# Patient Record
Sex: Male | Born: 1985 | Race: White | Hispanic: No | Marital: Single | Smoking: Never smoker
Health system: Southern US, Community
[De-identification: ages and names within clinical notes are randomized; demographics above are authoritative.]

## PROBLEM LIST (undated history)

## (undated) HISTORY — PX: FOOT SURGERY: SHX648

---
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
## 2014-03-26 ENCOUNTER — Emergency Department (HOSPITAL_COMMUNITY)
Admission: EM | Admit: 2014-03-26 | Discharge: 2014-03-26 | Disposition: A | Discharge status: Home / Self Care | Payer: BC Managed Care – PPO | Attending: Emergency Medicine | Admitting: Emergency Medicine

## 2014-03-26 ENCOUNTER — Encounter (HOSPITAL_COMMUNITY): Payer: Self-pay | Admitting: Emergency Medicine

## 2014-03-26 DIAGNOSIS — R110 Nausea: Secondary | ICD-10-CM | POA: Insufficient documentation

## 2014-03-26 DIAGNOSIS — R319 Hematuria, unspecified: Secondary | ICD-10-CM | POA: Diagnosis not present

## 2014-03-26 DIAGNOSIS — R630 Anorexia: Secondary | ICD-10-CM | POA: Diagnosis not present

## 2014-03-26 DIAGNOSIS — K920 Hematemesis: Secondary | ICD-10-CM | POA: Diagnosis not present

## 2014-03-26 DIAGNOSIS — R5381 Other malaise: Secondary | ICD-10-CM | POA: Diagnosis present

## 2014-03-26 DIAGNOSIS — R112 Nausea with vomiting, unspecified: Secondary | ICD-10-CM

## 2014-03-26 LAB — URINE MICROSCOPIC-ADD ON

## 2014-03-26 LAB — URINALYSIS, ROUTINE W REFLEX MICROSCOPIC
Glucose, UA: NEGATIVE mg/dL
KETONES UR: NEGATIVE mg/dL
Leukocytes, UA: NEGATIVE
Nitrite: NEGATIVE
PH: 6 (ref 5.0–8.0)
Protein, ur: NEGATIVE mg/dL
Specific Gravity, Urine: 1.025 (ref 1.005–1.030)
Urobilinogen, UA: 1 mg/dL (ref 0.0–1.0)

## 2014-03-26 LAB — CBC
HCT: 48.5 % (ref 39.0–52.0)
Hemoglobin: 16.3 g/dL (ref 13.0–17.0)
MCH: 28.3 pg (ref 26.0–34.0)
MCHC: 33.6 g/dL (ref 30.0–36.0)
MCV: 84.2 fL (ref 78.0–100.0)
Platelets: 321 K/uL (ref 150–400)
RBC: 5.76 MIL/uL (ref 4.22–5.81)
RDW: 13.6 % (ref 11.5–15.5)
WBC: 11.3 K/uL — ABNORMAL HIGH (ref 4.0–10.5)

## 2014-03-26 LAB — LIPASE, BLOOD: Lipase: 20 U/L (ref 11–59)

## 2014-03-26 LAB — COMPREHENSIVE METABOLIC PANEL
ALT: 51 U/L (ref 0–53)
AST: 39 U/L — ABNORMAL HIGH (ref 0–37)
Albumin: 5 g/dL (ref 3.5–5.2)
Alkaline Phosphatase: 61 U/L (ref 39–117)
Anion gap: 16 — ABNORMAL HIGH (ref 5–15)
BUN: 8 mg/dL (ref 6–23)
CO2: 27 mEq/L (ref 19–32)
Calcium: 10.4 mg/dL (ref 8.4–10.5)
Chloride: 97 mEq/L (ref 96–112)
Creatinine, Ser: 1.09 mg/dL (ref 0.50–1.35)
GFR calc Af Amer: >90 mL/min (ref >90)
GFR calc non Af Amer: >90 mL/min (ref >90)
Glucose, Bld: 106 mg/dL — ABNORMAL HIGH (ref 70–99)
Potassium: 4.1 mEq/L (ref 3.7–5.3)
Sodium: 140 mEq/L (ref 137–147)
Total Bilirubin: 1.5 mg/dL — ABNORMAL HIGH (ref 0.3–1.2)
Total Protein: 8.8 g/dL — ABNORMAL HIGH (ref 6.0–8.3)

## 2014-03-26 MED ORDER — THIAMINE HCL 100 MG/ML IJ SOLN
100.0000 mg | Freq: Once | INTRAMUSCULAR | Status: AC
Start: 2014-03-26 — End: 2014-03-26
  Administered 2014-03-26: 100 mg via INTRAVENOUS
  Filled 2014-03-26: qty 2

## 2014-03-26 MED ORDER — SODIUM CHLORIDE 0.9 % IV BOLUS (SEPSIS)
1000.0000 mL | Freq: Once | INTRAVENOUS | Status: AC
  Administered 2014-03-26: 1000 mL via INTRAVENOUS

## 2014-03-26 MED ORDER — ONDANSETRON HCL 4 MG/2ML IJ SOLN
4.0000 mg | Freq: Once | INTRAMUSCULAR | Status: AC
  Administered 2014-03-26: 4 mg via INTRAVENOUS
  Filled 2014-03-26: qty 2

## 2014-03-26 NOTE — Discharge Instructions (Signed)
Be sure to take your prescribed medication for nausea as needed as well as get plenty of sleep.  Stay well hydrated.  Please avoid greasy fried food as well as diary products to decreased risk of worsening nausea and vomiting for at least 3-4 days.  See below for further instructions.

## 2014-03-26 NOTE — ED Notes (Signed)
Pt reports to ED for emesis x 3 days, with hematemesis starting last night, weakness and shakiness yesterday evening.

## 2014-03-26 NOTE — ED Notes (Signed)
Patient has not been able to eat or drink much over the last 3 days. The vomiting has been going on on and off for about 2 months.

## 2014-03-26 NOTE — ED Notes (Signed)
Pt given sprite to drink and is tolerating w/o difficulty.

## 2014-03-26 NOTE — ED Provider Notes (Signed)
CSN: [id number]     Arrival date & time 03/26/14  1730 History   First MD Initiated Contact with Patient 03/26/14 2058     Chief Complaint  Patient presents with  . Hematemesis  . Weakness     (Consider location/radiation/quality/duration/timing/severity/associated sxs/prior Treatment) HPI .Pt is a 28yo male with no significant PMH presenting to ED with c/o 3 day hx of nausea and vomiting with episodes of hematemesis started yesterday.  Reports 5-6 episodes of emesis yesterday and 2 episodes today. Reports small amount of blood mixed with emesis.  Pt denies fever, abdominal pain, diarrhea or urinary symptoms. Denies sick contacts. [place] beach [place] where he was evaluated at an urgent care yesterday. States he was told he had hematuria but no other tests were performed. Reports hx of renal stones but states those episodes causes severe pain, denies pain at this time.  Denies any medications at home.  Denies hx of abdominal surgeries.   History reviewed. No pertinent past medical history. No past surgical history on file. No family history on file. History  Substance Use Topics  . Smoking status: Not on file  . Smokeless tobacco: Not on file  . Alcohol Use: Not on file    Review of Systems  Constitutional: Positive for appetite change. Negative for fever, chills and fatigue.  Respiratory: Negative for cough and shortness of breath.   Cardiovascular: Negative for chest pain and palpitations.  Gastrointestinal: Positive for nausea and vomiting ( with hematemesis ). Negative for abdominal pain, diarrhea, constipation and blood in stool.  Genitourinary: Positive for hematuria ( reports from labs at urgent care). Negative for dysuria, urgency, frequency, flank pain, decreased urine volume, discharge, penile pain and testicular pain.  Musculoskeletal: Negative for back pain and myalgias.  All other systems reviewed and are negative.     Allergies  Review  of patient's allergies indicates no known allergies.  Home Medications   Prior to Admission medications   Medication Sig Start Date End Date Taking? Authorizing Provider  pseudoephedrine (SUDAFED) 30 MG tablet Take 30 mg by mouth every 4 (four) hours as needed for congestion.   Yes Historical Provider, MD   BP 121/87  Pulse 83  Temp(Src) 98.1 F (36.7 C) (Oral)  Resp 18  SpO2 97% Physical Exam  Nursing note and vitals reviewed. Constitutional: He appears well-developed and well-nourished.  Pt lying comfortably in exam bed, NAD  HENT:  Head: Normocephalic and atraumatic.  Mouth/Throat: Oropharynx is clear and moist.  Moist mucous membranes  Eyes: Conjunctivae are normal. No scleral icterus.  Neck: Normal range of motion. Neck supple.  Cardiovascular: Normal rate, regular rhythm and normal heart sounds.   Pulmonary/Chest: Effort normal and breath sounds normal. No respiratory distress. He has no wheezes. He has no rales. He exhibits no tenderness.  Abdominal: Soft. Bowel sounds are normal. He exhibits no distension and no mass. There is no tenderness. There is no rebound and no guarding.  Soft, non-distended, non-tender. No CVAT  Musculoskeletal: Normal range of motion.  Neurological: He is alert.  Skin: Skin is warm and dry.    ED Course  Procedures (including critical care time) Labs Review Labs Reviewed  CBC - Abnormal; Notable for the following:    WBC 11.3 (*)    All other components within normal limits  COMPREHENSIVE METABOLIC PANEL - Abnormal; Notable for the following:    Glucose, Bld 106 (*)    Total Protein 8.8 (*)    AST  39 (*)    Total Bilirubin 1.5 (*)    Anion gap 16 (*)    All other components within normal limits  URINALYSIS, ROUTINE W REFLEX MICROSCOPIC - Abnormal; Notable for the following:    Color, Urine AMBER (*)    Hgb urine dipstick MODERATE (*)    Bilirubin Urine SMALL (*)    All other components within normal limits  LIPASE, BLOOD  URINE  MICROSCOPIC-ADD ON    Imaging Review No results found.   EKG Interpretation None      MDM   Final diagnoses:  Nausea and vomiting, vomiting of unspecified type  Hematemesis with nausea    Pt is a 28yo male presenting to ED with c/o nausea, vomiting and hematemesis that started yesterday.  Emesis was 5-6 episodes yesterday but only 2 episodes of emesis today.  Pt appears well hydrated. Afebrile. Labs: hematuria present, otherwise unremarkable.  Pt denies abdominal or back pain.  Abd: soft, non-distended, non-tender. No CVAT.  Not concerned for emergent process taking place including SBO, cholecystitis, or appendicitis.  IV fluids, thiamine, and zofran given in ED. Pt able to keep down several ounces of soda. Will discharge home to f/u with PCP. Advised to take fill prescription for phenergan prescribed by urgent care yesterday. Return precautions provided. Pt verbalized understanding and agreement with tx plan.      Junius Finnerrin O'Malley, PA-C 03/26/14 2341

## 2014-03-30 NOTE — ED Provider Notes (Signed)
Medical screening examination/treatment/procedure(s) were performed by non-physician practitioner and as supervising physician I was immediately available for consultation/collaboration.   EKG Interpretation None        Christopher J. Pollina, MD 03/30/14 1500 

## 2016-12-06 ENCOUNTER — Encounter (HOSPITAL_BASED_OUTPATIENT_CLINIC_OR_DEPARTMENT_OTHER): Payer: Self-pay | Admitting: Emergency Medicine

## 2016-12-06 ENCOUNTER — Emergency Department (HOSPITAL_BASED_OUTPATIENT_CLINIC_OR_DEPARTMENT_OTHER)
Admission: EM | Admit: 2016-12-06 | Discharge: 2016-12-06 | Disposition: A | Discharge status: Home / Self Care | Payer: Worker's Compensation | Attending: Emergency Medicine | Admitting: Emergency Medicine

## 2016-12-06 ENCOUNTER — Emergency Department (HOSPITAL_BASED_OUTPATIENT_CLINIC_OR_DEPARTMENT_OTHER): Payer: Worker's Compensation

## 2016-12-06 DIAGNOSIS — Y9389 Activity, other specified: Secondary | ICD-10-CM | POA: Insufficient documentation

## 2016-12-06 DIAGNOSIS — S99911A Unspecified injury of right ankle, initial encounter: Secondary | ICD-10-CM | POA: Diagnosis present

## 2016-12-06 DIAGNOSIS — Y929 Unspecified place or not applicable: Secondary | ICD-10-CM | POA: Insufficient documentation

## 2016-12-06 DIAGNOSIS — W1842XA Slipping, tripping and stumbling without falling due to stepping into hole or opening, initial encounter: Secondary | ICD-10-CM | POA: Diagnosis not present

## 2016-12-06 DIAGNOSIS — S93491A Sprain of other ligament of right ankle, initial encounter: Secondary | ICD-10-CM | POA: Insufficient documentation

## 2016-12-06 DIAGNOSIS — Y990 Civilian activity done for income or pay: Secondary | ICD-10-CM | POA: Diagnosis not present

## 2016-12-06 MED ORDER — ACETAMINOPHEN 500 MG PO TABS
1000.0000 mg | ORAL_TABLET | Freq: Once | ORAL | Status: AC
  Administered 2016-12-06: 1000 mg via ORAL
  Filled 2016-12-06: qty 2

## 2016-12-06 MED ORDER — IBUPROFEN 800 MG PO TABS
800.0000 mg | ORAL_TABLET | Freq: Once | ORAL | Status: AC
  Administered 2016-12-06: 800 mg via ORAL
  Filled 2016-12-06: qty 1

## 2016-12-06 NOTE — Discharge Instructions (Signed)
Take 4 over the counter ibuprofen tablets 3 times a day or 2 over-the-counter naproxen tablets twice a day for pain. Also take tylenol 1000mg(2 extra strength) four times a day.    

## 2016-12-06 NOTE — ED Notes (Signed)
Patient transported to X-ray 

## 2016-12-06 NOTE — ED Triage Notes (Signed)
States," I rolled my right ankle at work on Thursday and my boss sent me here today because I am having pain to my right leg and ankle. Swelling noted to outer aspect of right ankle, CMS intact

## 2016-12-06 NOTE — ED Provider Notes (Signed)
[facility] Provider [facility]   CSN: [id number] Arrival date & time: 12/06/16  1049     History   Chief Complaint Chief Complaint  Patient presents with  . Ankle Pain    HPI Matthew Le is a 31 y.o. male.  31 yo M with a chief complaint of right lateral ankle pain. Patient stepped into a hole at work couple days ago and inverted his ankle. Since then he's been having pain with ambulation. Feels that the swelling is gotten worse over the past couple days and was having trouble walking around at work and was sent here. States he does have some lateral leg pain as well. Denies any other injury in the fall.   The history is provided by the patient.  Ankle Pain   The incident occurred 2 days ago. The incident occurred at work. Injury mechanism: The patient stepped into a hole and inverted his ankle. The pain is present in the right ankle. The quality of the pain is described as aching and throbbing. The pain is at a severity of 7/10. The pain is moderate. The pain has been constant since onset. Pertinent negatives include no numbness and no inability to bear weight. Associated symptoms comments: Pain with bearing weight. He reports no foreign bodies present. Nothing aggravates the symptoms. He has tried nothing for the symptoms. The treatment provided no relief.    History reviewed. No pertinent past medical history.  There are no active problems to display for this patient.   Past Surgical History:  Procedure Laterality Date  . FOOT SURGERY Right        Home Medications    Prior to Admission medications   Medication Sig Start Date End Date Taking? Authorizing Provider  pseudoephedrine (SUDAFED) 30 MG tablet Take 30 mg by mouth every 4 (four) hours as needed for congestion.    Historical Provider, MD    Family History No family history on file.  Social History Social History  Substance Use Topics  . Smoking status: Never Smoker  . Smokeless tobacco: Never  Used  . Alcohol use Yes     Comment: social      Allergies   Patient has no known allergies.   Review of Systems Review of Systems  Constitutional: Negative for chills and fever.  HENT: Negative for congestion and facial swelling.   Eyes: Negative for discharge and visual disturbance.  Respiratory: Negative for shortness of breath.   Cardiovascular: Negative for chest pain and palpitations.  Gastrointestinal: Negative for abdominal pain, diarrhea and vomiting.  Musculoskeletal: Positive for arthralgias, gait problem and joint swelling. Negative for myalgias.  Skin: Negative for color change and rash.  Neurological: Negative for tremors, syncope, numbness and headaches.  Psychiatric/Behavioral: Negative for confusion and dysphoric mood.     Physical Exam Updated Vital Signs BP 124/66 (BP Location: Right Arm)   Pulse 77   Temp 98.9 F (37.2 C) (Oral)   Resp 16   Ht 5\' 7"  (1.702 m)   Wt 200 lb (90.7 kg)   SpO2 100%   BMI 31.32 kg/m   Physical Exam  Constitutional: He is oriented to person, place, and time. He appears well-developed and well-nourished.  HENT:  Head: Normocephalic and atraumatic.  Eyes: EOM are normal. Pupils are equal, round, and reactive to light.  Neck: Normal range of motion. Neck supple. No JVD present.  Cardiovascular: Normal rate and regular rhythm.  Exam reveals no gallop and no friction rub.   No murmur  heard. Pulmonary/Chest: No respiratory distress. He has no wheezes.  Abdominal: He exhibits no distension. There is no rebound and no guarding.  Musculoskeletal: Normal range of motion. He exhibits edema and tenderness.  Tender to palpation about the ATFL. Mild edema. Some proximal fibular pain. Pulse motor and sensation is intact distally.  Neurological: He is alert and oriented to person, place, and time.  Skin: No rash noted. No pallor.  Psychiatric: He has a normal mood and affect. His behavior is normal.  Nursing note and vitals  reviewed.    ED Treatments / Results  Labs (all labs ordered are listed, but only abnormal results are displayed) Labs Reviewed - No data to display  EKG  EKG Interpretation None       Radiology Dg Tibia/fibula Right  Result Date: 12/06/2016 CLINICAL DATA:  Pain following rolling type injury 3 days prior EXAM: RIGHT TIBIA AND FIBULA - 2 VIEW COMPARISON:  Right ankle images December 06, 2016 FINDINGS: Frontal and lateral views obtained. No fracture or dislocation. Soft tissue swelling distally with small ankle joint effusion noted, raising question for ligamentous injury in the ankle region. No abnormal periosteal reaction. No knee joint effusion. IMPRESSION: Question ligamentous injury in the ankle region. No fracture or dislocation. No knee joint effusion. Electronically Signed   By: Bretta Bang III M.D.   On: 12/06/2016 11:55   Dg Ankle Complete Right  Result Date: 12/06/2016 CLINICAL DATA:  Pain after rolling type injury 3 days prior EXAM: RIGHT ANKLE - COMPLETE 3+ VIEW COMPARISON:  None. FINDINGS: Frontal, oblique, and lateral views were obtained. There is soft tissue swelling with small joint effusion. No fracture evident. No appreciable joint space narrowing. No erosive change. Areas of relative osteopenia in the calcaneus may represent an anatomic variant. There is a small posterior calcaneal spur. IMPRESSION: Soft tissue swelling with small joint effusion. Question underlying ligamentous injury. No fracture evident. No appreciable arthropathy. There is a small posterior calcaneal spur. Electronically Signed   By: Bretta Bang III M.D.   On: 12/06/2016 11:54    Procedures Procedures (including critical care time)  Medications Ordered in ED Medications  acetaminophen (TYLENOL) tablet 1,000 mg (1,000 mg Oral Given 12/06/16 1126)  ibuprofen (ADVIL,MOTRIN) tablet 800 mg (800 mg Oral Given 12/06/16 1126)     Initial Impression / Assessment and Plan / ED Course  I have  reviewed the triage vital signs and the nursing notes.  Pertinent labs & imaging results that were available during my care of the patient were reviewed by me and considered in my medical decision making (see chart for details).     31 yo M With a chief complaint of right ankle pain. Likely sprain based on physical exam will obtain plain films. Xray without fx.  D/c home, place in aso, crutches, PCP follow up.   I have discussed the diagnosis/risks/treatment options with the patient and believe the pt to be eligible for discharge home to follow-up with PCP. We also discussed returning to the ED immediately if new or worsening sx occur. We discussed the sx which are most concerning (e.g., continued pain) that necessitate immediate return. Medications administered to the patient during their visit and any new prescriptions provided to the patient are listed below.  Medications given during this visit Medications  acetaminophen (TYLENOL) tablet 1,000 mg (1,000 mg Oral Given 12/06/16 1126)  ibuprofen (ADVIL,MOTRIN) tablet 800 mg (800 mg Oral Given 12/06/16 1126)     The patient appears reasonably screen and/or stabilized  for discharge and I doubt any other medical condition or other [facility] requiring further screening, evaluation, or treatment in the ED at this time prior to discharge.    Final Clinical Impressions(s) / ED Diagnoses   Final diagnoses:  Sprain of anterior talofibular ligament of right ankle, initial encounter    New Prescriptions Discharge Medication List as of 12/06/2016 12:00 PM       Melene Plan, DO 12/06/16 1444

## 2016-12-06 NOTE — ED Notes (Signed)
Small ice pack applied to right ankle

## 2018-02-20 IMAGING — CR DG ANKLE COMPLETE 3+V*R*
3 series · 3 of 3 positions shown · non-contrast
Comparison: None.

CLINICAL DATA: Pain after rolling type injury 3 days prior

EXAM:
RIGHT ANKLE - COMPLETE 3+ VIEW

[t ankle joint ap right]
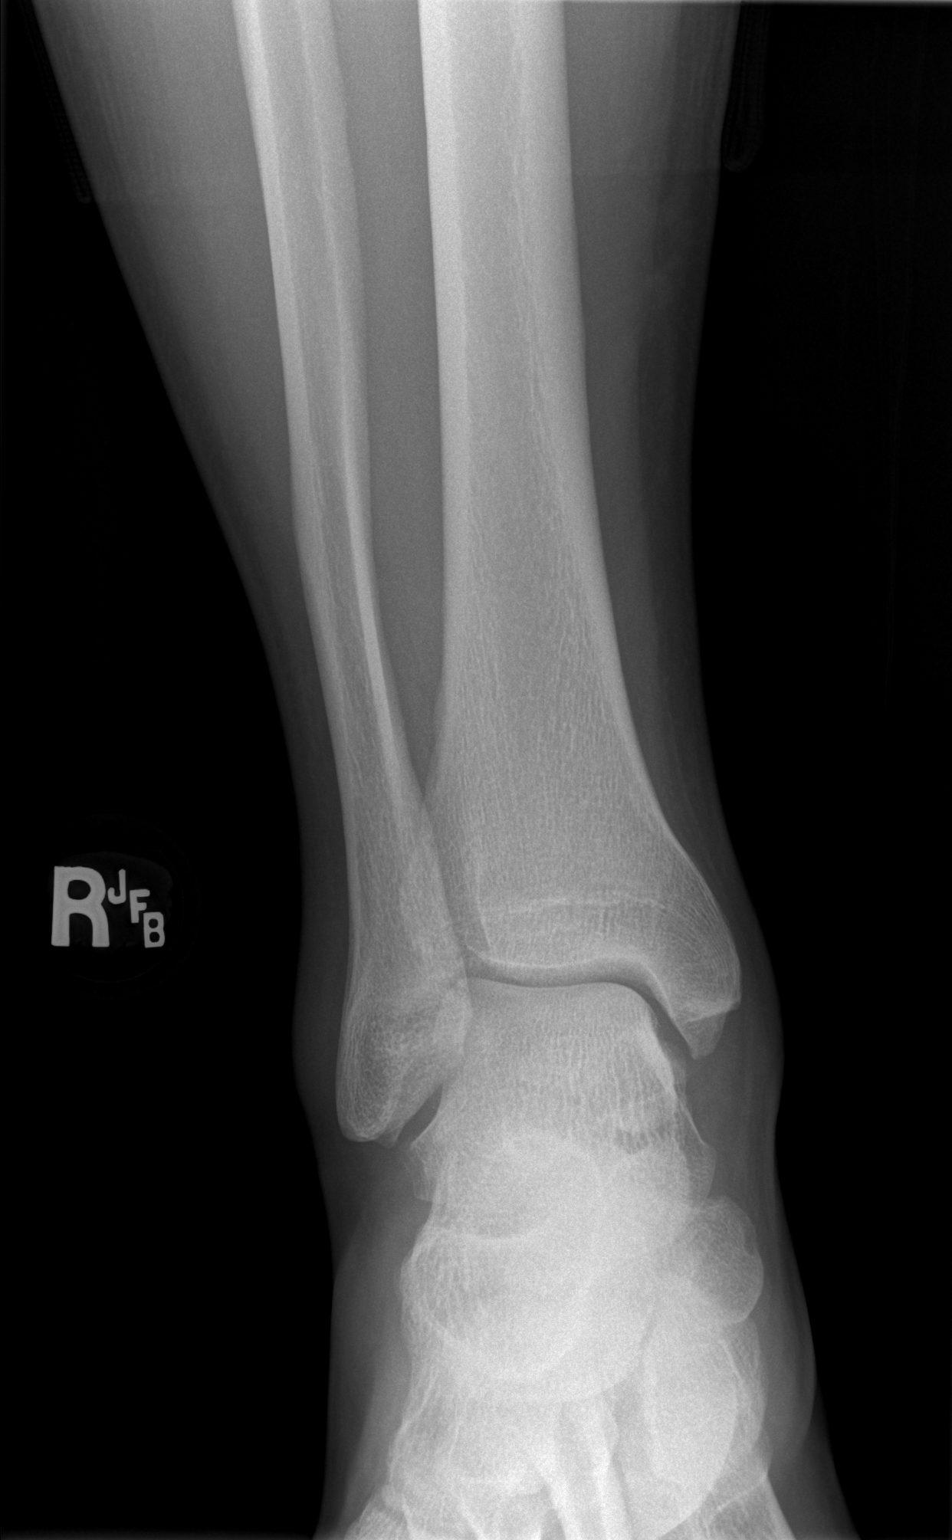

[t ankle joint oblique right]
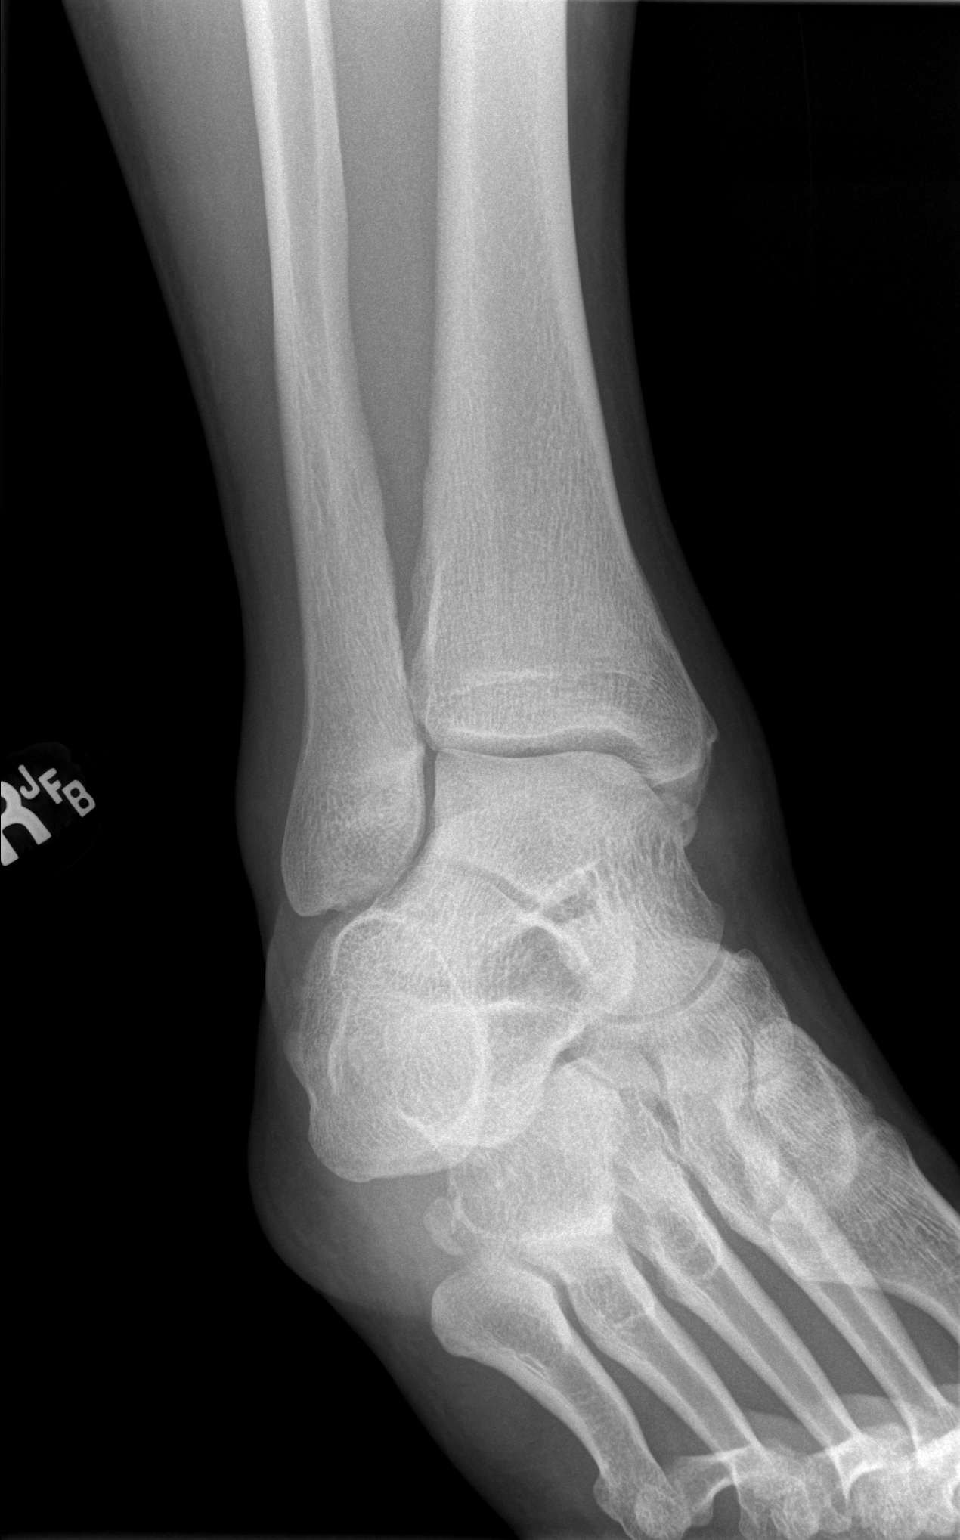

[t ankle joint lat right]
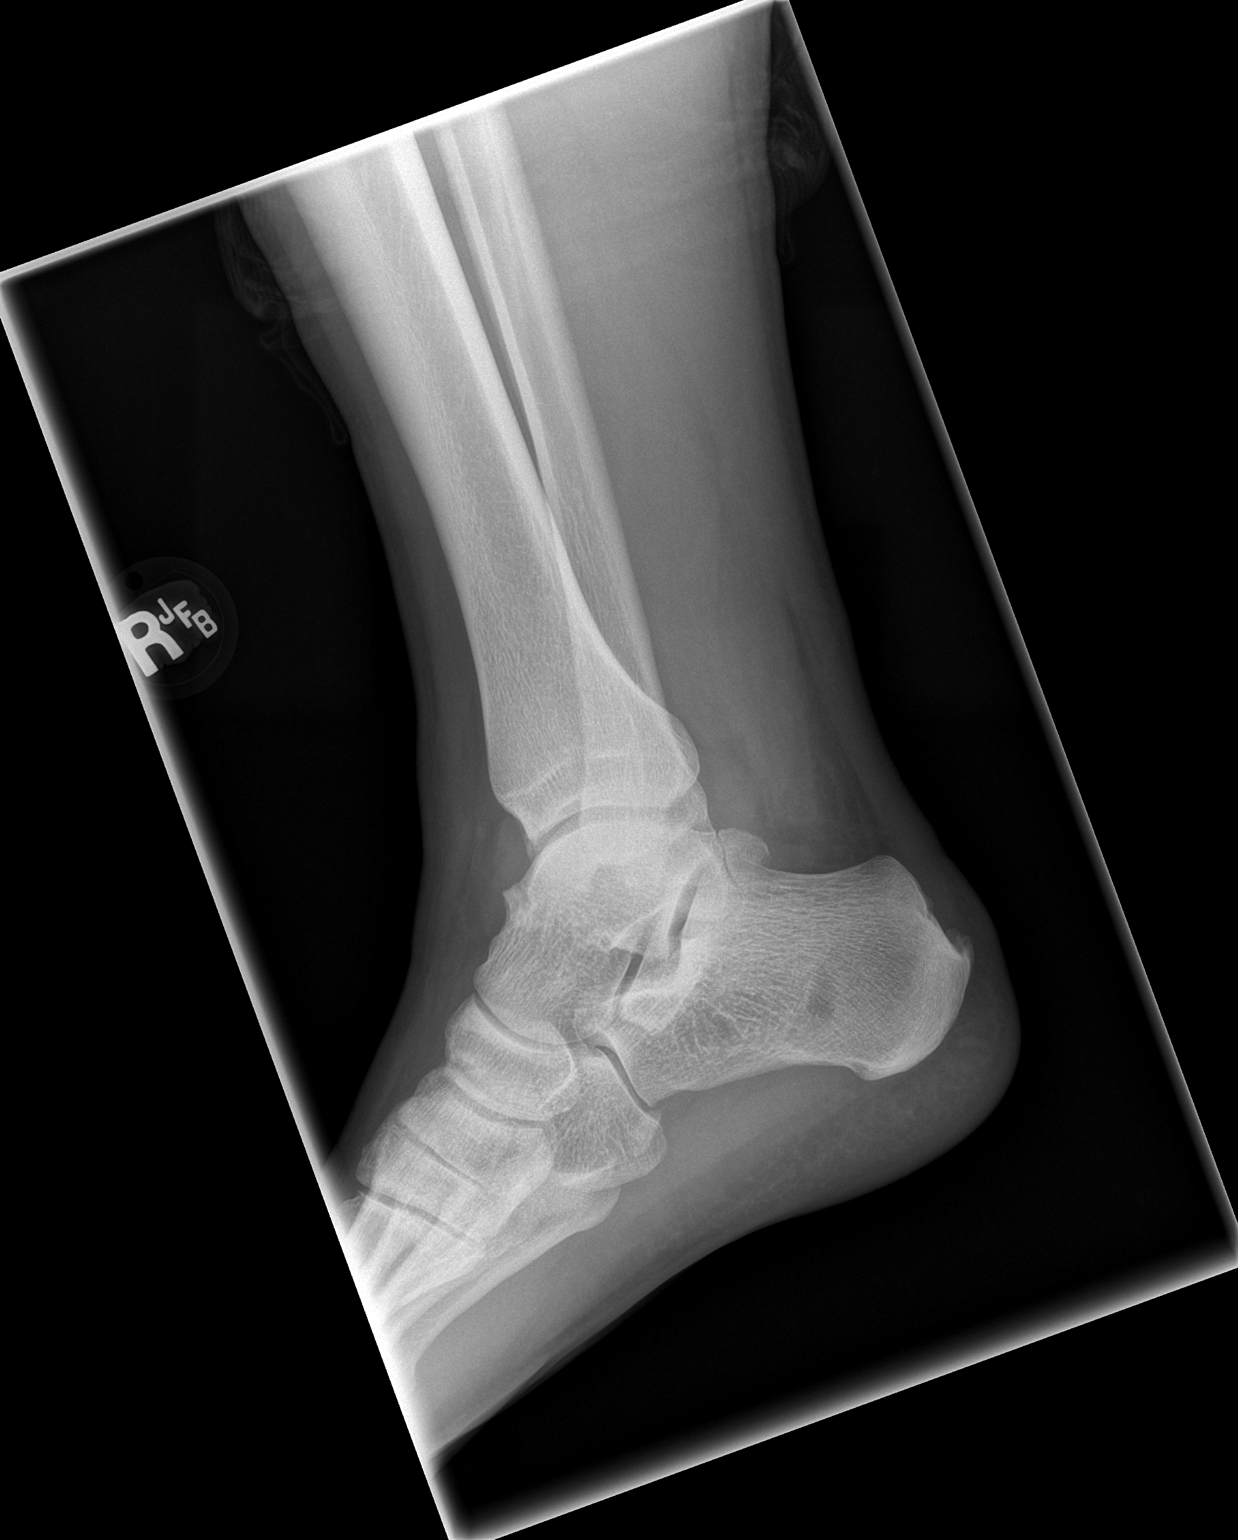

[3 of 3 positions shown; findings below may reference images not displayed]

FINDINGS: Frontal, oblique, and lateral views were obtained. There is soft
tissue swelling with small joint effusion. No fracture evident. No
appreciable joint space narrowing. No erosive change. Areas of
relative osteopenia in the calcaneus may represent an anatomic
variant. There is a small posterior calcaneal spur.
IMPRESSION: Soft tissue swelling with small joint effusion. Question underlying
ligamentous injury. No fracture evident. No appreciable arthropathy.
There is a small posterior calcaneal spur.

## 2018-10-12 ENCOUNTER — Emergency Department (HOSPITAL_COMMUNITY)
Admission: EM | Admit: 2018-10-12 | Discharge: 2018-10-13 | Disposition: A | Discharge status: Home / Self Care | Payer: PRIVATE HEALTH INSURANCE | Attending: Emergency Medicine | Admitting: Emergency Medicine

## 2018-10-12 ENCOUNTER — Emergency Department (HOSPITAL_COMMUNITY): Payer: PRIVATE HEALTH INSURANCE

## 2018-10-12 ENCOUNTER — Other Ambulatory Visit: Payer: Self-pay

## 2018-10-12 ENCOUNTER — Encounter (HOSPITAL_COMMUNITY): Payer: Self-pay | Admitting: Emergency Medicine

## 2018-10-12 DIAGNOSIS — R072 Precordial pain: Secondary | ICD-10-CM | POA: Diagnosis not present

## 2018-10-12 DIAGNOSIS — R079 Chest pain, unspecified: Secondary | ICD-10-CM | POA: Diagnosis present

## 2018-10-12 LAB — CBC
HCT: 47.3 % (ref 39.0–52.0)
Hemoglobin: 15.3 g/dL (ref 13.0–17.0)
MCH: 27.6 pg (ref 26.0–34.0)
MCHC: 32.3 g/dL (ref 30.0–36.0)
MCV: 85.4 fL (ref 80.0–100.0)
Platelets: 342 10*3/uL (ref 150–400)
RBC: 5.54 MIL/uL (ref 4.22–5.81)
RDW: 13.2 % (ref 11.5–15.5)
WBC: 10.1 10*3/uL (ref 4.0–10.5)
nRBC: 0 % (ref 0.0–0.2)

## 2018-10-12 LAB — BASIC METABOLIC PANEL
Anion gap: 14 (ref 5–15)
BUN: 10 mg/dL (ref 6–20)
CALCIUM: 9.5 mg/dL (ref 8.9–10.3)
CO2: 26 mmol/L (ref 22–32)
CREATININE: 1.17 mg/dL (ref 0.61–1.24)
Chloride: 97 mmol/L — ABNORMAL LOW (ref 98–111)
GFR calc Af Amer: >60 mL/min (ref >60)
GFR calc non Af Amer: >60 mL/min (ref >60)
Glucose, Bld: 167 mg/dL — ABNORMAL HIGH (ref 70–99)
Potassium: 3.6 mmol/L (ref 3.5–5.1)
Sodium: 137 mmol/L (ref 135–145)

## 2018-10-12 LAB — I-STAT TROPONIN, ED: Troponin i, poc: 0 ng/mL (ref 0.00–0.08)

## 2018-10-12 MED ORDER — SODIUM CHLORIDE 0.9% FLUSH: 3.0000 mL | Freq: Once | INTRAVENOUS | Status: DC

## 2018-10-12 NOTE — ED Triage Notes (Signed)
C/o intermittent sharp non-radiating chest pain since yesterday with mild SOB.  Denies nausea and vomiting.

## 2018-10-13 MED ORDER — IBUPROFEN 400 MG PO TABS
400.0000 mg | ORAL_TABLET | Freq: Once | ORAL | Status: AC
  Administered 2018-10-13: 400 mg via ORAL
  Filled 2018-10-13: qty 1

## 2018-10-13 NOTE — ED Provider Notes (Signed)
[facility] EMERGENCY DEPARTMENT Provider Note   CSN: [id number] Arrival date & time: 10/12/18  2137    Patient gives permission to perform history and physical in front of family/friend History   Chief Complaint Chief Complaint  Patient presents with  . Chest Pain    HPI Matthew Le is a 33 y.o. male.  The history is provided by the patient and a significant other.  Chest Pain  Pain location:  Substernal area Pain quality: sharp   Pain radiates to:  Does not radiate Pain severity:  Moderate Onset quality:  Sudden Timing:  Intermittent Progression:  Unchanged Chronicity:  New Context: movement   Context: not breathing   Relieved by:  Rest Worsened by:  Exertion Associated symptoms: shortness of breath   Associated symptoms: no abdominal pain, no back pain, no cough, no fever, no heartburn, no lower extremity edema, no syncope, no vomiting and no weakness   Risk factors: no coronary artery disease, no diabetes mellitus and no hypertension   Patient presents for chest pain.  He reports starting yesterday he began having central/substernal chest pain while at rest.  He reports it is mild all the time, but will intermittently worsen for 10 to 15 seconds.  It is sharp, but not pleuritic.  Seems to be worse with walking, improved with rest.  He reports mild shortness of breath.  There is no hemoptysis. No recent trauma or falls, but does heavy lifting at work Reports mild dyspnea on exertion.  No history of CAD/PE.  No recent travel Reports about 2 nights ago he had 2 episodes of vomiting, but has been normal since then.  No pain with eating.  No vomiting in the past 24 hours Chest pain does not radiate.  There is no tearing sensation. PMH-none Soc hx - no drug abuse, nonsmoker, no travel fam hx - negative for CAD Past Surgical History:  Procedure Laterality Date  . FOOT SURGERY Right         Home Medications    Prior to Admission medications     Medication Sig Start Date End Date Taking? Authorizing Provider  pseudoephedrine (SUDAFED) 30 MG tablet Take 30 mg by mouth every 4 (four) hours as needed for congestion.    [provider]    Family History No family history on file.  Social History Social History   Tobacco Use  . Smoking status: Never Smoker  . Smokeless tobacco: Never Used  Substance Use Topics  . Alcohol use: Yes    Comment: social   . Drug use: No     Allergies   Patient has no known allergies.   Review of Systems Review of Systems  Constitutional: Negative for fever.  Respiratory: Positive for shortness of breath. Negative for cough.   Cardiovascular: Positive for chest pain. Negative for leg swelling and syncope.  Gastrointestinal: Negative for abdominal pain, heartburn and vomiting.  Musculoskeletal: Negative for back pain.  Neurological: Negative for weakness.  All other systems reviewed and are negative.    Physical Exam Updated Vital Signs BP 128/75 (BP Location: Right Arm)   Pulse 79   Temp 98.2 F (36.8 C) (Oral)   Resp 16   Ht 1.702 m (5\' 7" )   Wt 99.8 kg   SpO2 100%   BMI 34.46 kg/m   Physical Exam CONSTITUTIONAL: Well developed/well nourished HEAD: Normocephalic/atraumatic EYES: EOMI/PERRL ENMT: Mucous membranes moist NECK: supple no meningeal signs SPINE/BACK:entire spine nontender CV: S1/S2 noted, no murmurs/rubs/gallops noted LUNGS:  Lungs are clear to auscultation bilaterally, no apparent distress Chest-no tenderness, no crepitus ABDOMEN: soft, nontender, no rebound or guarding, bowel sounds noted throughout abdomen GU:no cva tenderness NEURO: Pt is awake/alert/appropriate, moves all extremitiesx4.  No facial droop.   EXTREMITIES: pulses normal/equalx4, full ROM, no leg edema SKIN: warm, color normal PSYCH: no abnormalities of mood noted, alert and oriented to situation   ED Treatments / Results  Labs (all labs ordered are listed, but only abnormal  results are displayed) Labs Reviewed  BASIC METABOLIC PANEL - Abnormal; Notable for the following components:      Result Value   Chloride 97 (*)    Glucose, Bld 167 (*)    All other components within normal limits  CBC  I-STAT TROPONIN, ED    EKG EKG Interpretation  Date/Time:  Thursday October 13 2018 00:47:22 EST Ventricular Rate:  82 PR Interval:  144 QRS Duration: 94 QT Interval:  371 QTC Calculation: 434 R Axis:   61 Text Interpretation:  Sinus rhythm Normal ECG Confirmed by Zadie Rhine ([id number]) on 10/13/2018 1:25:56 AM   Radiology Dg Chest 2 View  Result Date: 10/12/2018 CLINICAL DATA:  Central chest pain and shortness of breath for 2 days. EXAM: CHEST - 2 VIEW COMPARISON:  None. FINDINGS: Cardiomediastinal silhouette is normal. No pleural effusions or focal consolidations. Trachea projects midline and there is no pneumothorax. Soft tissue planes and included osseous structures are non-suspicious. Mild RIGHT acromioclavicular osteoarthrosis. IMPRESSION: Negative. Electronically Signed   By: Awilda Metro M.D.   On: 10/12/2018 22:31    Procedures Procedures  Medications Ordered in ED Medications  sodium chloride flush (NS) 0.9 % injection 3 mL (has no administration in time range)  ibuprofen (ADVIL,MOTRIN) tablet 400 mg (400 mg Oral Given 10/13/18 0114)     Initial Impression / Assessment and Plan / ED Course  I have reviewed the triage vital signs and the nursing notes.  Pertinent labs & imaging results that were available during my care of the patient were reviewed by me and considered in my medical decision making (see chart for details).     Patient presented with chest pain of unclear etiology.  He reports it has been constant for a day, with intermittent worsening for 10 to 15 seconds.  It is not pleuritic.  He walked in the ER without difficulty.  No hypoxia, no tachycardia.  No chest pain while walking. He is very low risk for ACS.  He appears PERC  negative at this time.  My suspicion for dissection is low. Pericarditis is possible, therefore advised anti-inflammatories.  Final Clinical Impressions(s) / ED Diagnoses   Final diagnoses:  Precordial pain    ED Discharge Orders    None       Zadie Rhine, MD 10/13/18 [phone]

## 2018-10-13 NOTE — Discharge Instructions (Addendum)

## 2018-10-13 NOTE — ED Notes (Signed)
Ambulated pt in hallway. Pt's o2 stats stayed within normal limits. 96%

## 2019-10-06 ENCOUNTER — Encounter: Payer: Self-pay | Admitting: Medical

## 2019-10-06 ENCOUNTER — Other Ambulatory Visit: Payer: Self-pay

## 2019-10-06 ENCOUNTER — Ambulatory Visit (INDEPENDENT_AMBULATORY_CARE_PROVIDER_SITE_OTHER): Payer: PRIVATE HEALTH INSURANCE | Admitting: Medical

## 2019-10-06 ENCOUNTER — Telehealth: Payer: Self-pay

## 2019-10-06 VITALS — Temp 98.3°F | Ht 67.0 in | Wt 220.0 lb

## 2019-10-06 DIAGNOSIS — Z8616 Personal history of COVID-19: Secondary | ICD-10-CM

## 2019-10-06 NOTE — Patient Instructions (Addendum)
Nice to me today.  You have recent history of Covid infection and appears to do well fighting infection.  Presently asymptomatic and no reported fevers for 3 days.  10days past date of positive test.  I did write she return to work letter today.  Asked you to call back our office and give a fax number so we can send letter to them.  Your new patient and your recent CPE/wellness.  So recommend that you schedule a wellness exam in about 1 to 2weeks.  Remind you to get scheduled early in the morning so he can come in fasting for the labs.   Follow-up 1 to 2 weeks for CPE/wellness.

## 2019-10-06 NOTE — Telephone Encounter (Signed)
Patient called in to see if his prescription can be  Emailed to him if so his email [email or web address]  Please follow up with the patient at [phone] thanks.

## 2019-10-06 NOTE — Telephone Encounter (Signed)
Emailed note to patient 

## 2019-10-06 NOTE — Progress Notes (Signed)
   Subjective:    Patient ID: Matthew Le, male    DOB: November 19, 1985, 34 y.o.   MRN: [id number]  HPI  Virtual Visit via Video Note  I connected with Matthew Le on 10/06/19 at 10:00 AM EST by a video enabled telemedicine application and verified that I am speaking with the correct person using two identifiers.  Location: Patient: home Provider: office.   I discussed the limitations of evaluation and management by telemedicine and the availability of in person appointments. The patient expressed understanding and agreed to proceed.  History of Present Illness:   Pt in for first time.   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 Pt does not exercise.  Pt states just recenlty trying to eat healthy. Non smoker. Very rare alcohol. Only special occasions.   He had rapid covid test that came back positive on 09/25/2019. Pt states on Saturday before got bad nasal congestion, cough and st. One day of body aches. Pt not aware who may have been covid contact.  Pt state currently has no signs or symptoms. Pt states no anti-fever meds for 3 days and no fever.   Pt needs return to work note.   On review no hx of recent CPE.   Observations/Objective: General-no acute distress, pleasant, oriented. Normal speech.  Lungs- on inspection lungs appear unlabored. Neck- no tracheal deviation or jvd on inspection. Neuro- gross motor function appears intact.  Assessment and Plan: Nice to me today.  You have recent history of Covid infection and appears to do well fighting infection.  Presently asymptomatic and no reported fevers for 3 days.  10days past date of positive test.  I did write she return to work letter today.  Asked you to call back our office and give a fax number so we can send letter to them.  Your new patient and your recent CPE/wellness.  So recommend that you schedule a wellness exam in about 1 to 2weeks.  Remind you to get scheduled early in the  morning so he can come in fasting for the labs.   Follow-up 1 to 2 weeks for CPE/wellness.  Follow Up Instructions:    I discussed the assessment and treatment plan with the patient. The patient was provided an opportunity to ask questions and all were answered. The patient agreed with the plan and demonstrated an understanding of the instructions.   The patient was advised to call back or seek an in-person evaluation if the symptoms worsen or if the condition fails to improve as anticipated.     Esperanza Richters, PA-C    Review of Systems     Objective:   Physical Exam        Assessment & Plan:

## 2019-12-27 IMAGING — CR DG CHEST 2V
2 series · 2 of 2 positions shown · non-contrast
Comparison: None.

CLINICAL DATA: Central chest pain and shortness of breath for 2
days.

EXAM:
CHEST - 2 VIEW

[chest pa]
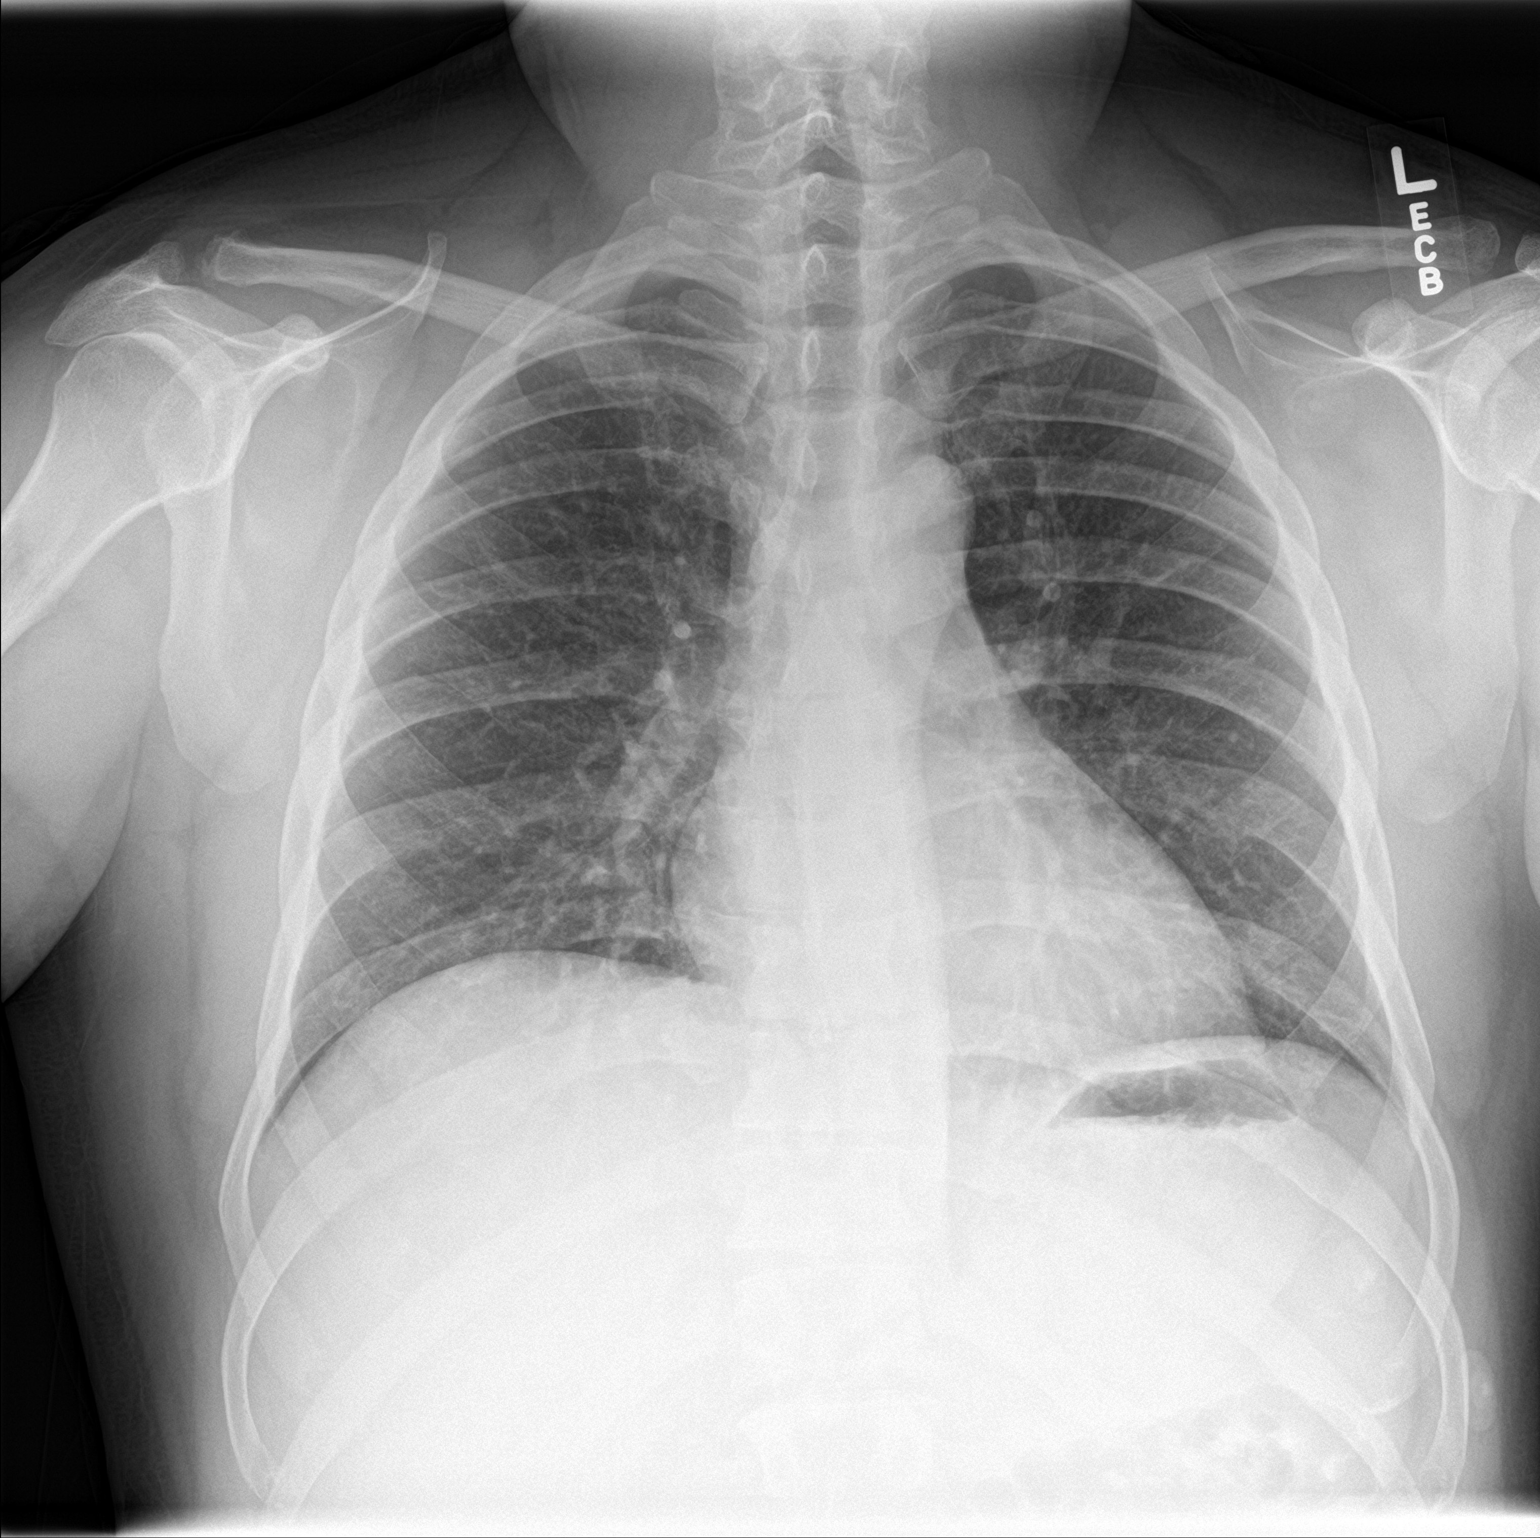

[chest lat]
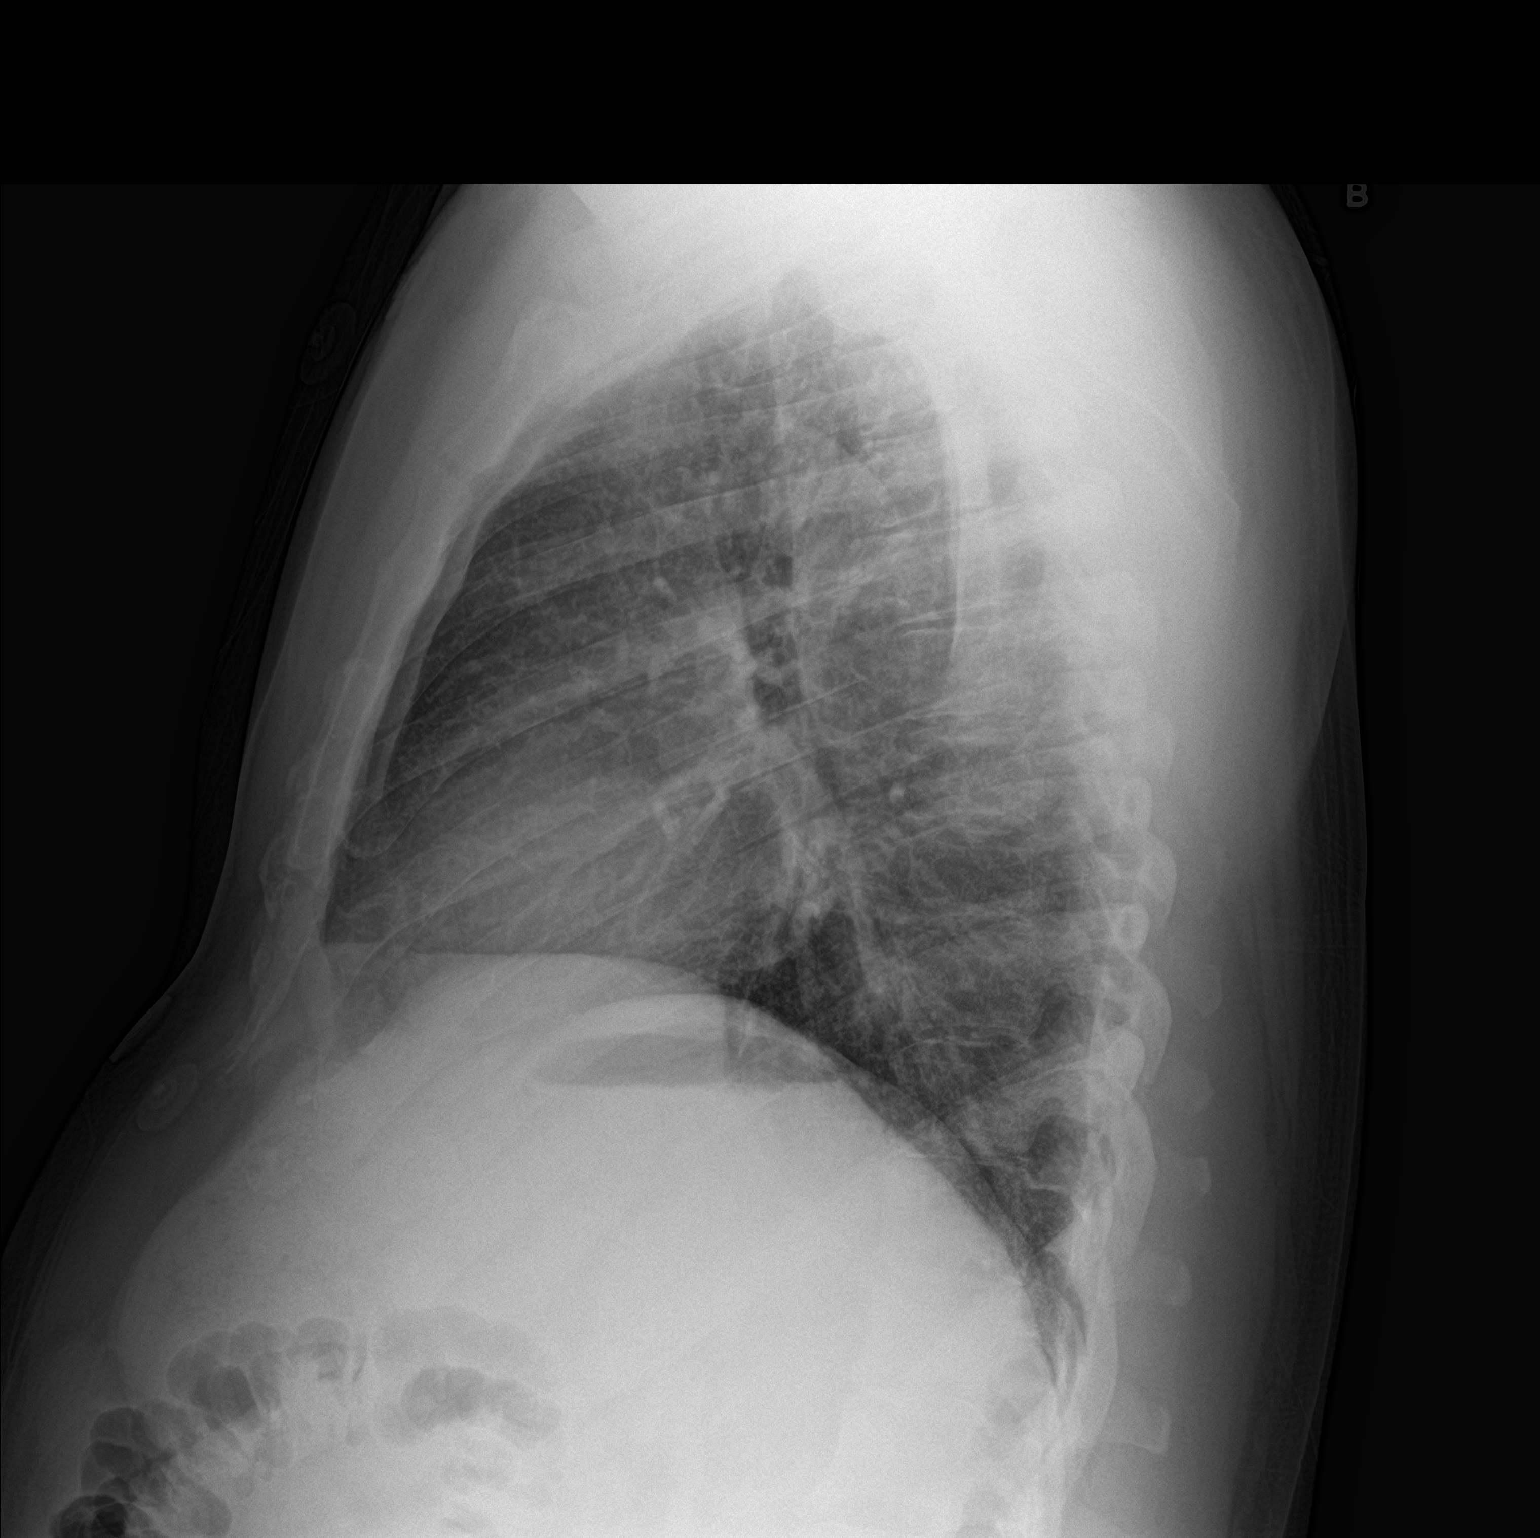

[2 of 2 positions shown; findings below may reference images not displayed]

FINDINGS: Cardiomediastinal silhouette is normal. No pleural effusions or
focal consolidations. Trachea projects midline and there is no
pneumothorax. Soft tissue planes and included osseous structures are
non-suspicious. Mild RIGHT acromioclavicular osteoarthrosis.
IMPRESSION: Negative.
